# Patient Record
Sex: Female | Born: 1945 | Hispanic: Yes | Marital: Married | Smoking: Never smoker
Health system: Southern US, Community
[De-identification: ages and names within clinical notes are randomized; demographics above are authoritative.]

---

## 2005-07-18 ENCOUNTER — Ambulatory Visit: Payer: Self-pay

## 2006-08-28 ENCOUNTER — Ambulatory Visit: Payer: Self-pay

## 2007-11-11 ENCOUNTER — Ambulatory Visit: Payer: Self-pay

## 2009-01-27 ENCOUNTER — Ambulatory Visit: Payer: Self-pay | Admitting: Family Medicine

## 2009-02-01 ENCOUNTER — Ambulatory Visit: Payer: Self-pay

## 2009-11-29 IMAGING — CR DG CHEST 1V
1 series · 2 of 2 positions shown · non-contrast
Comparison: none

REASON FOR EXAM: +ppd
COMMENTS:

[Series 1: view not recorded · 0.17mm/px · 2 of 2 slices shown]
[im 1/2]
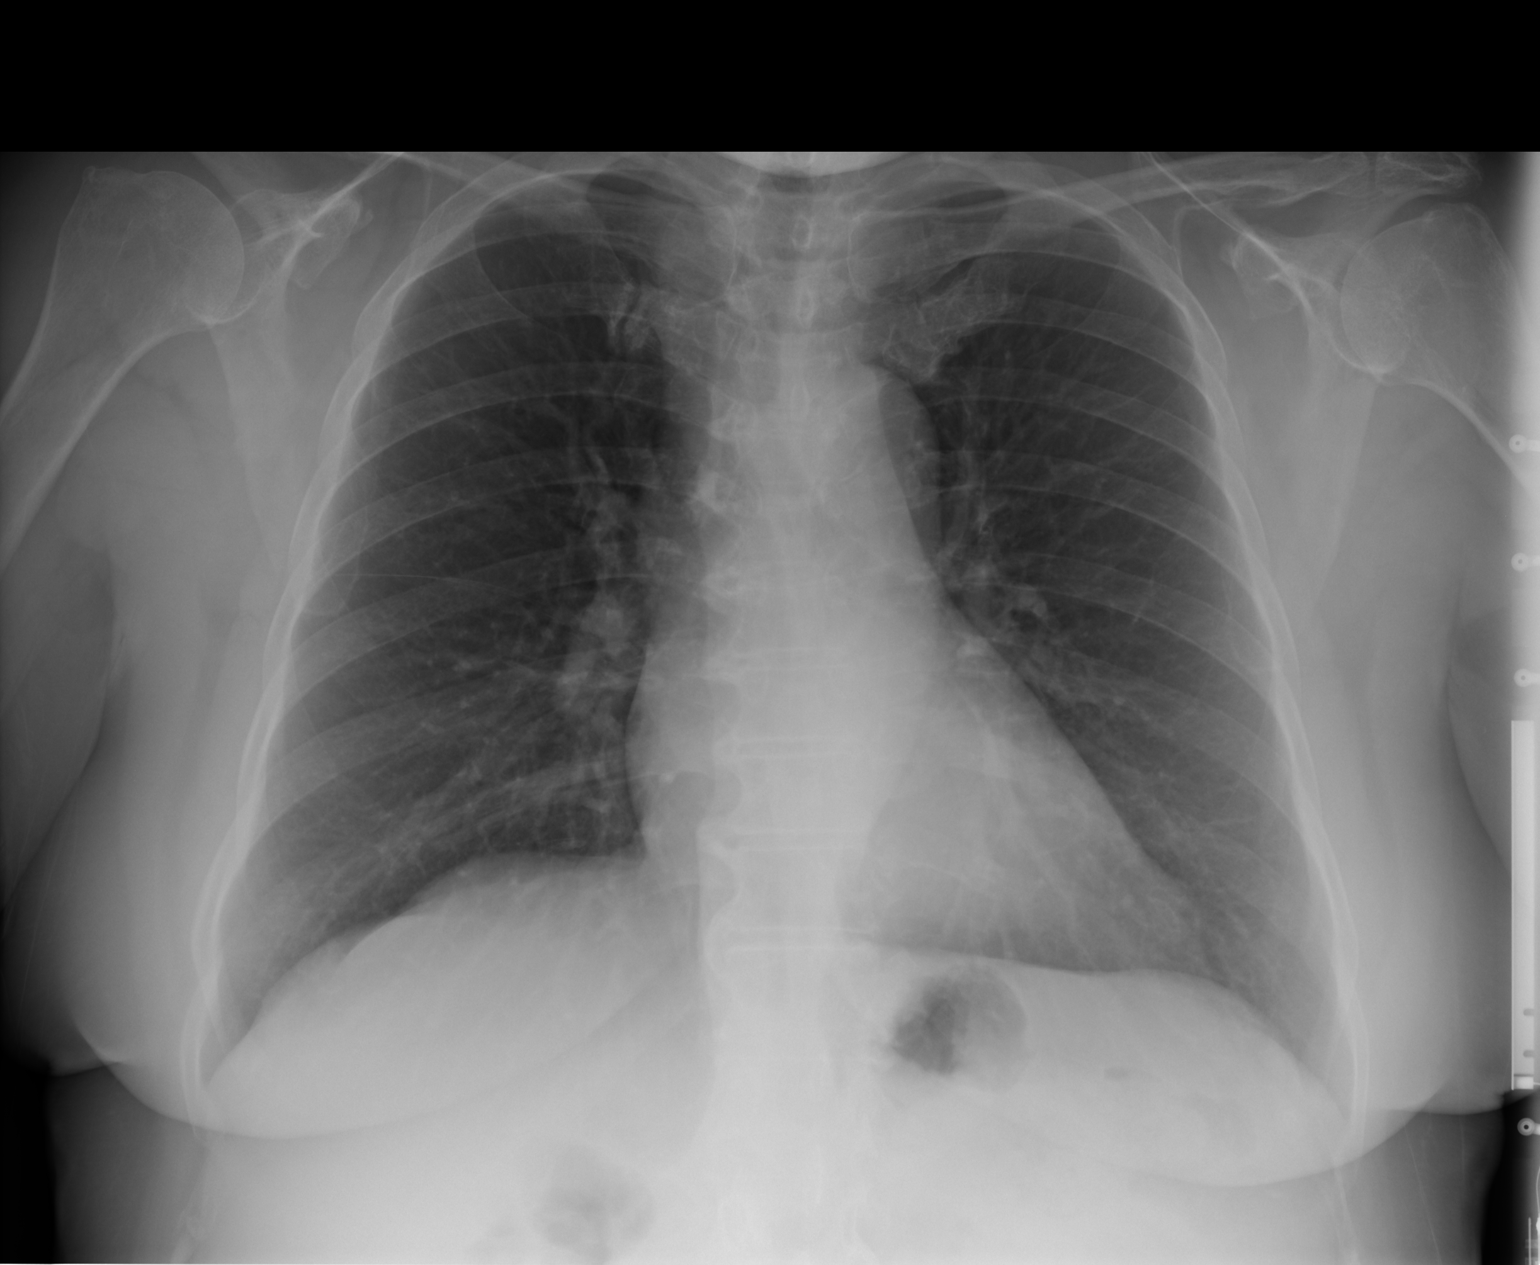
[im 2/2]
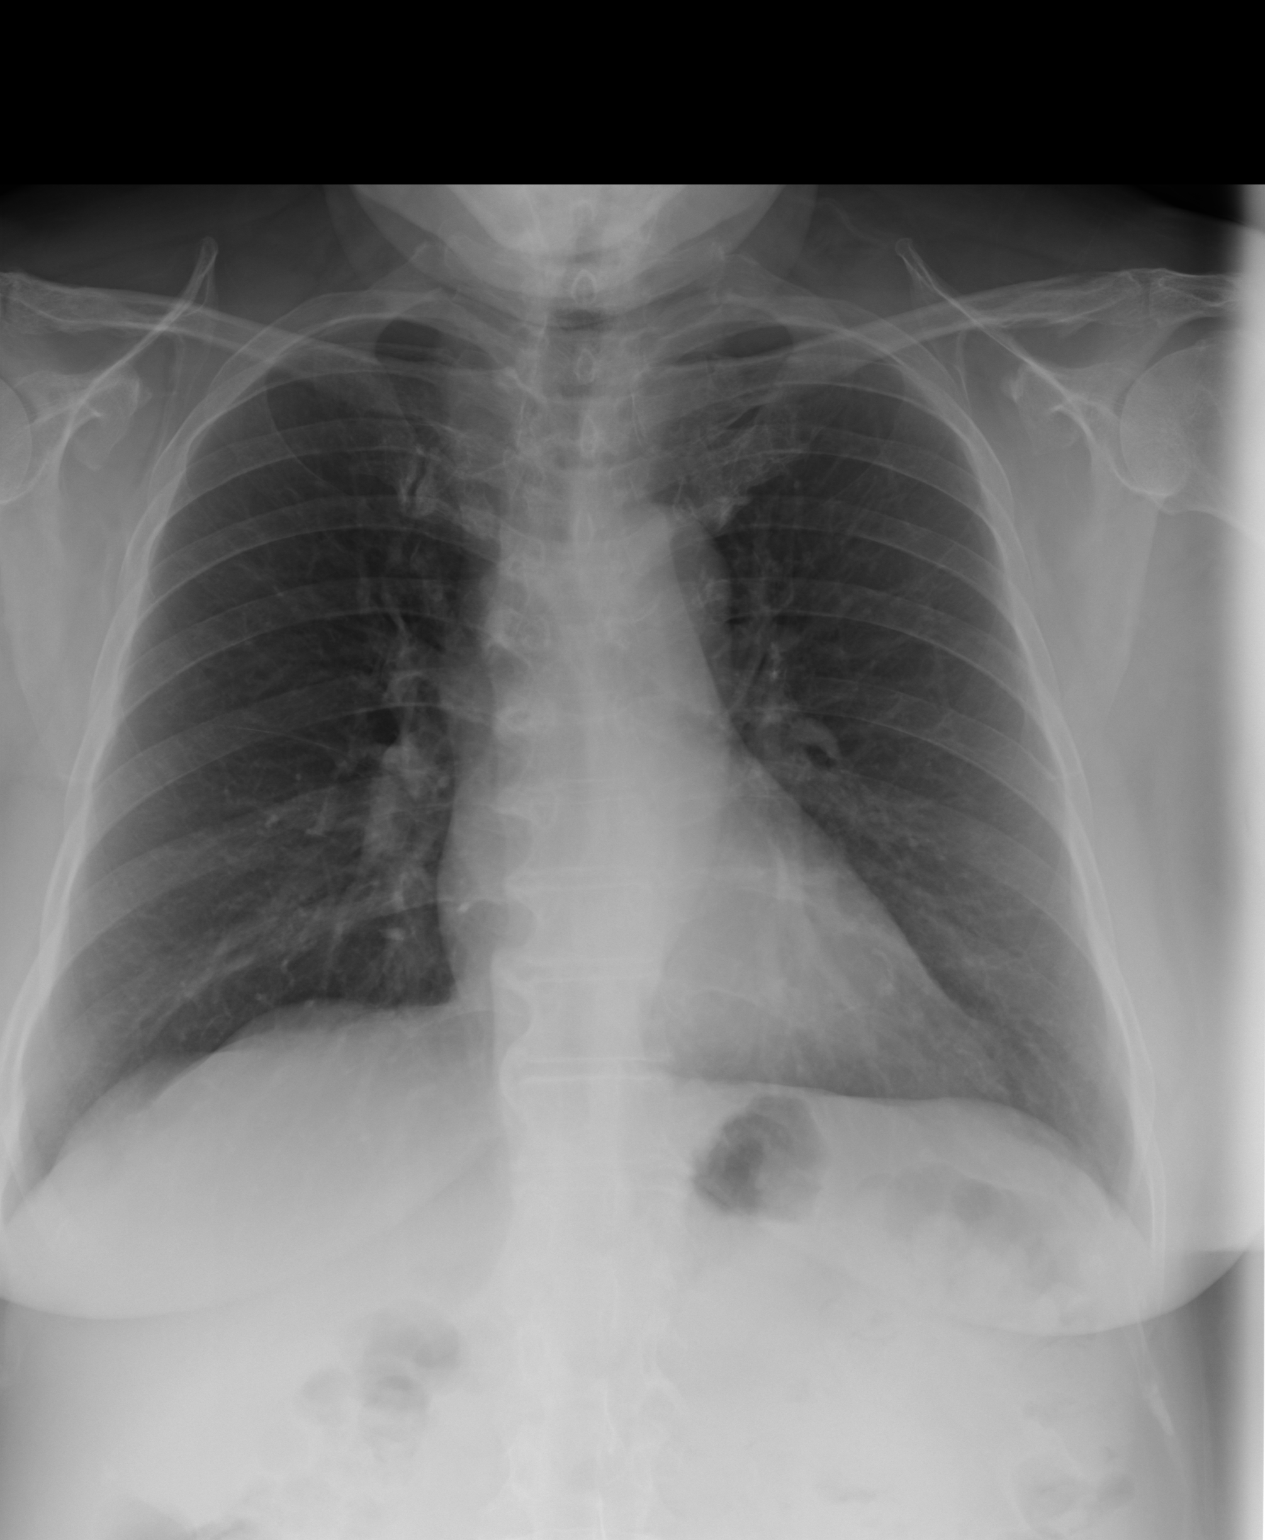

[2 of 2 positions shown; findings below may reference images not displayed]

PROCEDURE:     DXR - DXR CHEST 1 VIEWAP OR PA  - January 27, 2009  [DATE]

RESULT:     The lung fields are clear. No pneumonia, pneumothorax or pleural
effusion is seen. Attention to the lung apices shows no infiltrate or cystic
change suspicious for tuberculosis. Heart size is normal. Hypertrophic
spurring is present at multiple levels of the thoracic spine.
IMPRESSION: 1.     No acute changes are identified.

## 2010-05-10 ENCOUNTER — Ambulatory Visit: Payer: Self-pay | Admitting: Family Medicine

## 2012-03-17 ENCOUNTER — Ambulatory Visit: Payer: Self-pay | Admitting: Family Medicine

## 2012-05-13 ENCOUNTER — Ambulatory Visit: Payer: Self-pay | Admitting: Primary Care

## 2012-05-15 ENCOUNTER — Ambulatory Visit: Payer: Self-pay | Admitting: Primary Care

## 2013-01-17 IMAGING — US US THYROID
1 series · 17 of 25 positions shown · non-contrast
Comparison: none

REASON FOR EXAM: thyroid mass
COMMENTS:

[Series 1: us thyroid · 123 acquisitions, 17 frames shown]
[im 1/123]
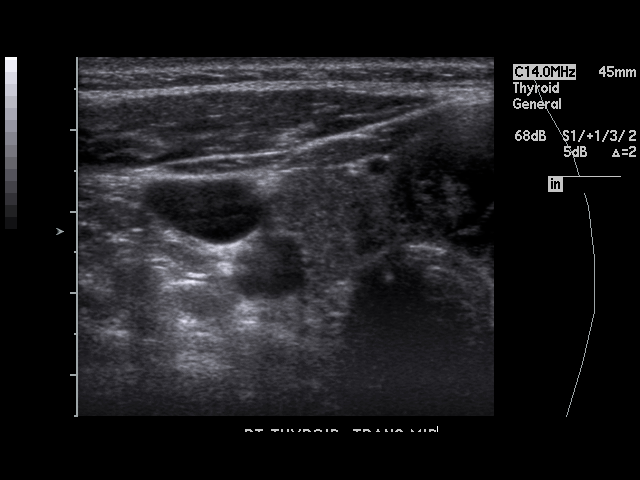
[im 11/123]
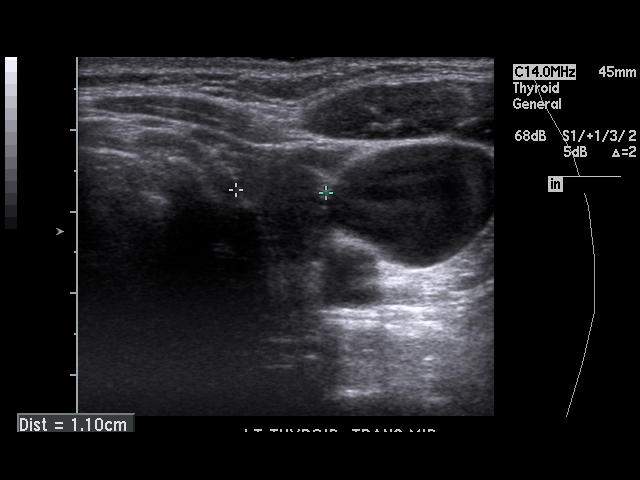
[im 16/123]
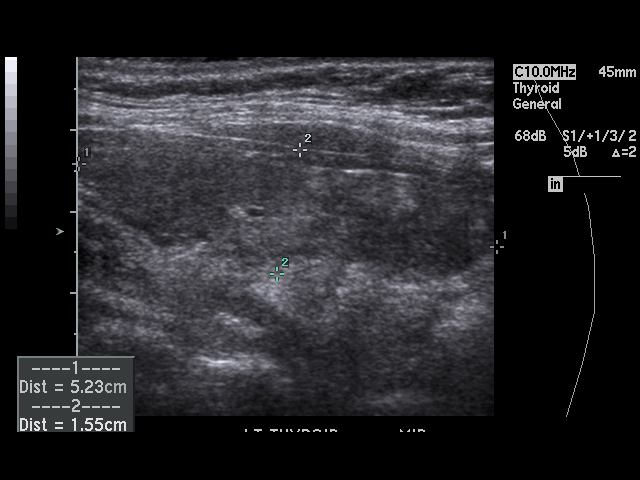
[im 26/123]
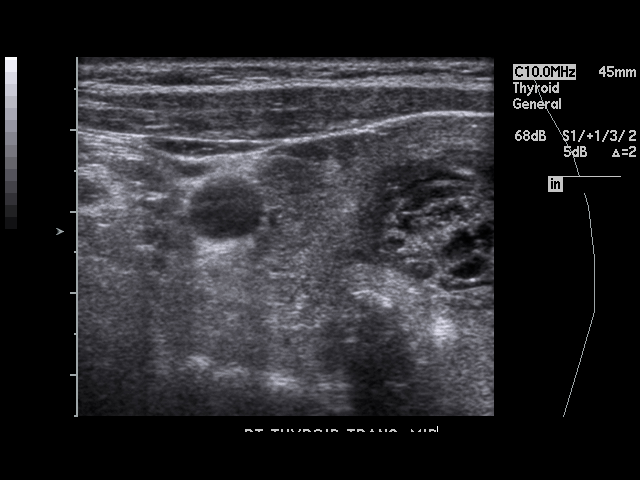
[im 31/123]
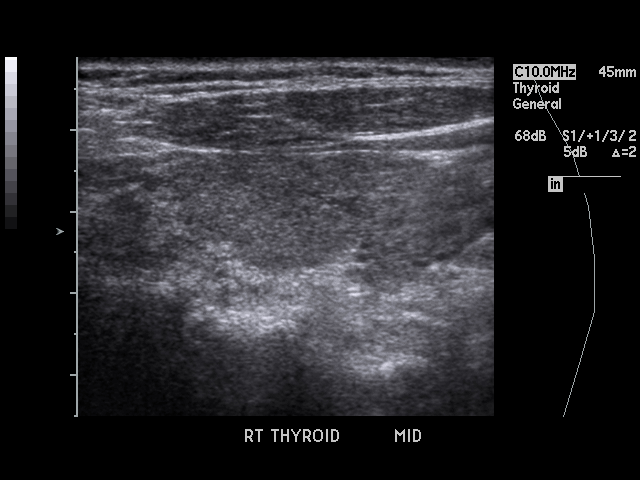
[im 41/123]
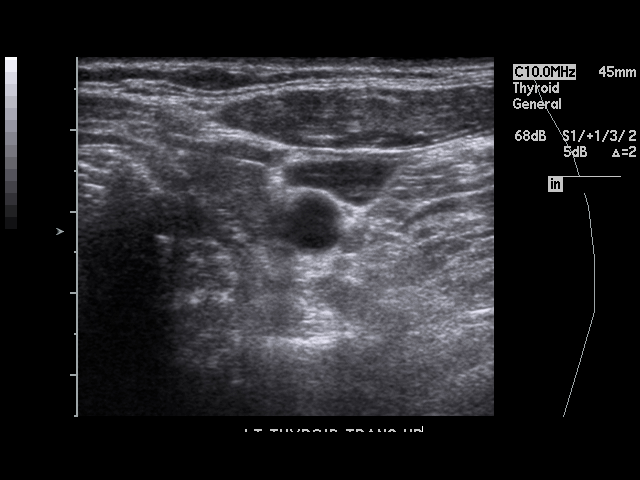
[im 46/123]
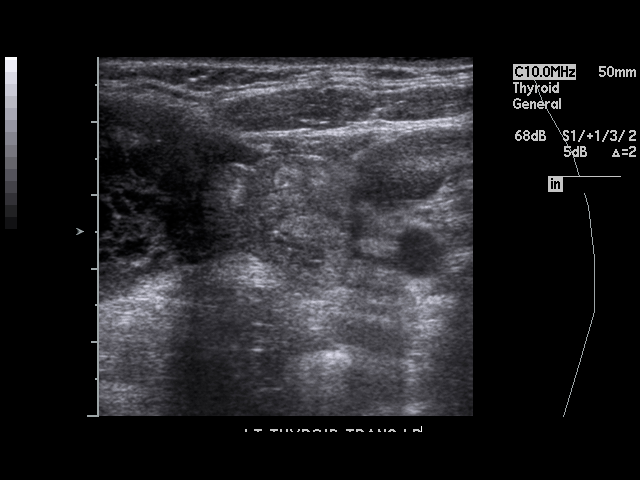
[im 56/123]
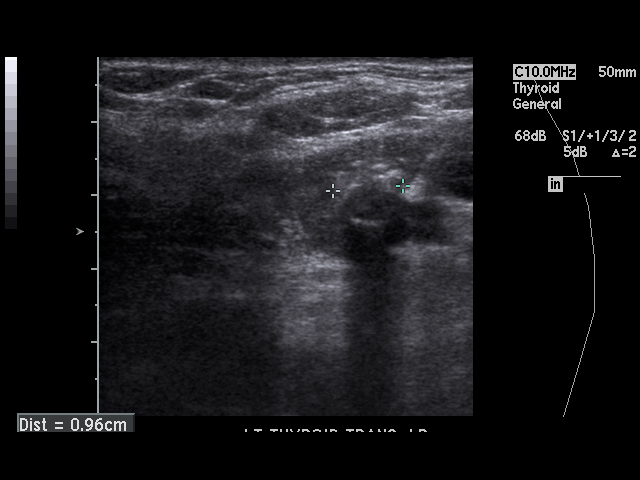
[im 62/123]
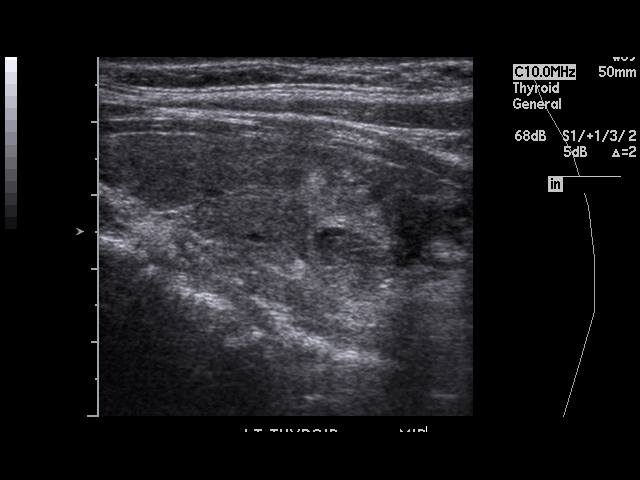
[im 67/123]
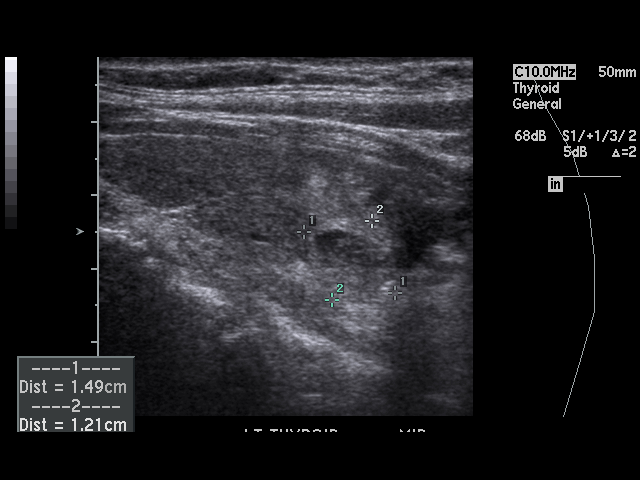
[im 77/123]
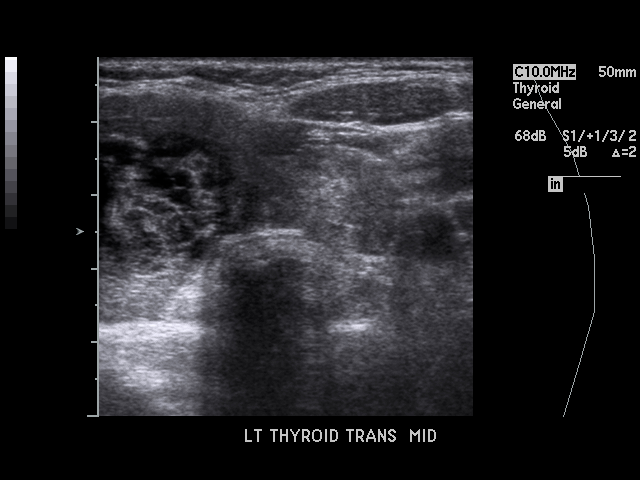
[im 82/123]
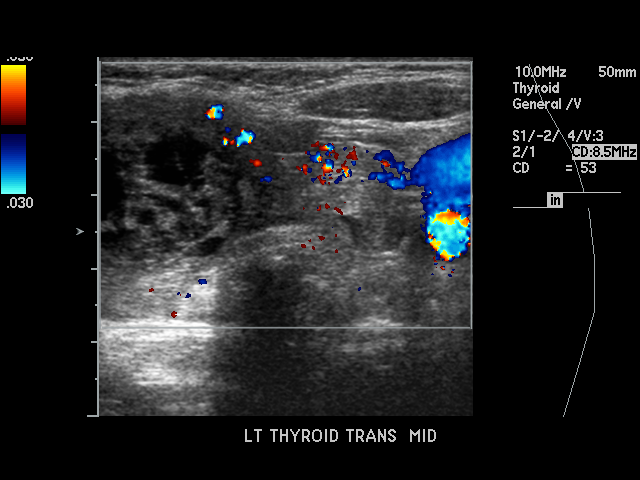
[im 92/123]
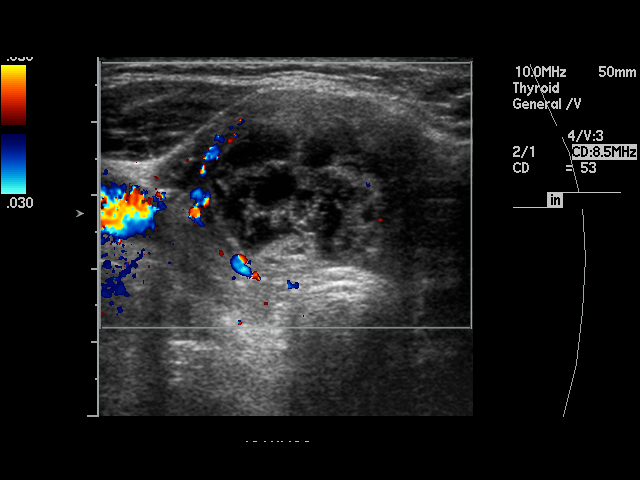
[im 97/123]
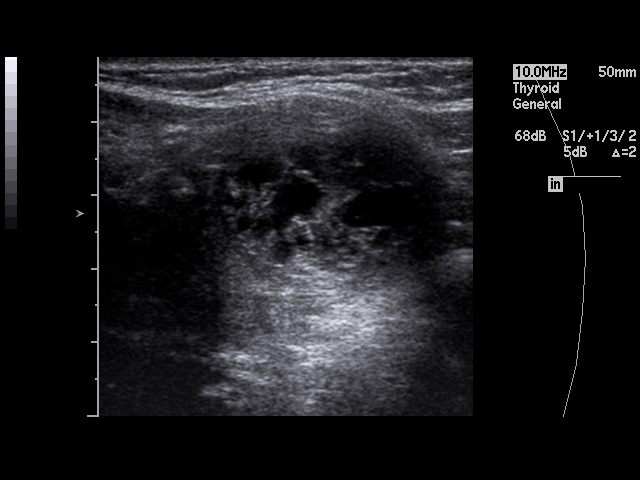
[im 107/123]
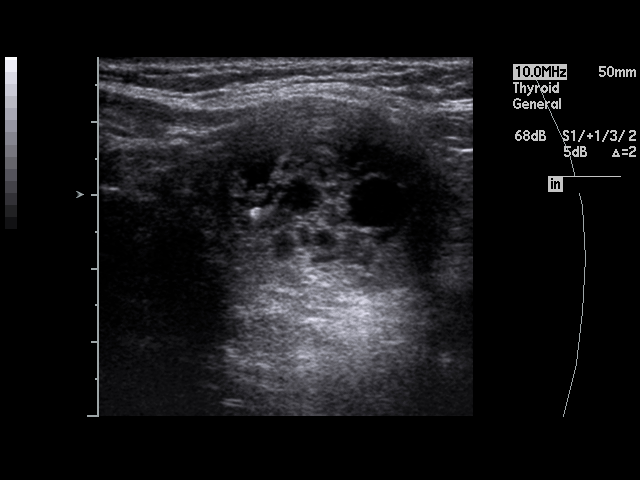
[im 112/123]
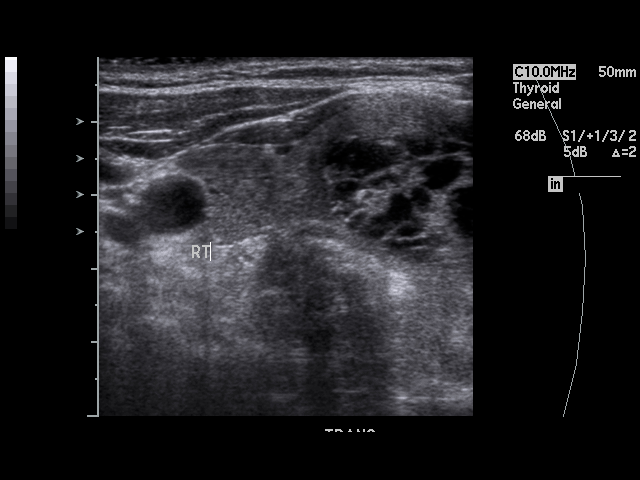
[im 123/123]
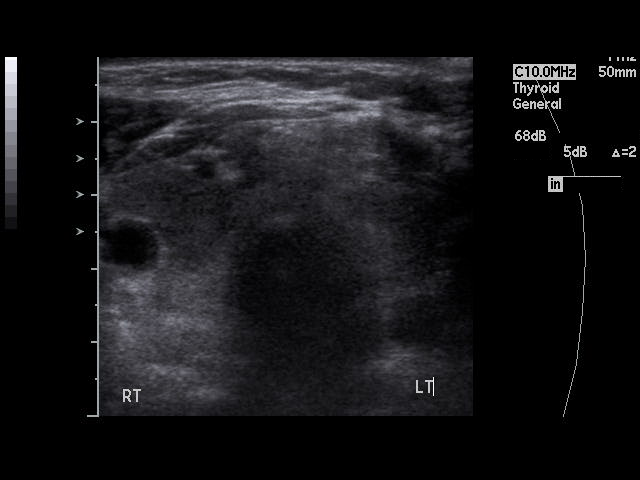

[17 of 25 positions shown; findings below may reference images not displayed]

PROCEDURE:     US  - US THYROID  - March 17, 2012  [DATE]

RESULT:     The right lobe of the thyroid measures 3.2 cm x 1.4 cm x 1.6 cm
and the left lobe measures 5.2 cm x 1.6 cm x 1.1 cm. In the thyroid isthmus,
there is a complex mass containing cystic elements and measuring 3.19 cm x
2.8 cm x 3.19 cm. In the right lobe of the thyroid, there is a slightly
hyperechoic nodule in the midpole region. This nodule measures 6.4 mm x
mm x 5.5 mm. In the left lobe, there are observed three nodules. The most
prominent is at the lower pole where there is a hypoechoic nodule measuring
1.27 cm in diameter. This nodule contains coarse calcifications. There is
also a 1.49 cm hypoechoic nodule at the lower pole and in the mid pole
region on the left there is a 1.03 cm, slightly hyperechoic solid nodule. No
associated calcifications are seen.
IMPRESSION: 1.  The left lobe is larger than the right.
2.  There is a complex, 3.19 cm mass of the thyroid isthmus.
3.  On the right, there is a hyperechoic 6.4 mm nodule.
4.  On the left, there are three nodules with the most prominent being at
the lower pole. This is a complex nodule containing coarse calcification.

## 2013-01-21 ENCOUNTER — Ambulatory Visit: Payer: Self-pay | Admitting: Primary Care

## 2013-07-28 ENCOUNTER — Encounter: Payer: Self-pay | Admitting: Urology

## 2013-08-24 ENCOUNTER — Encounter: Payer: Self-pay | Admitting: Urology

## 2013-09-23 ENCOUNTER — Encounter: Payer: Self-pay | Admitting: Urology

## 2013-10-24 ENCOUNTER — Encounter: Payer: Self-pay | Admitting: Urology

## 2013-11-13 ENCOUNTER — Ambulatory Visit (INDEPENDENT_AMBULATORY_CARE_PROVIDER_SITE_OTHER): Payer: Medicare Other | Admitting: Allergy

## 2014-07-01 ENCOUNTER — Ambulatory Visit: Payer: Self-pay | Admitting: Primary Care

## 2014-07-08 ENCOUNTER — Ambulatory Visit: Payer: Self-pay | Admitting: Primary Care

## 2016-06-28 ENCOUNTER — Other Ambulatory Visit: Payer: Self-pay | Admitting: Primary Care

## 2016-06-28 DIAGNOSIS — Z1231 Encounter for screening mammogram for malignant neoplasm of breast: Secondary | ICD-10-CM

## 2016-07-20 ENCOUNTER — Ambulatory Visit: Payer: Self-pay

## 2016-07-30 ENCOUNTER — Encounter: Payer: Self-pay | Admitting: Radiology

## 2016-07-30 ENCOUNTER — Ambulatory Visit
Admission: RE | Admit: 2016-07-30 | Discharge: 2016-07-30 | Disposition: A | Discharge status: Home / Self Care | Payer: Self-pay | Source: Ambulatory Visit | Attending: Primary Care | Admitting: Primary Care

## 2016-07-30 DIAGNOSIS — Z1231 Encounter for screening mammogram for malignant neoplasm of breast: Secondary | ICD-10-CM

## 2017-08-09 ENCOUNTER — Other Ambulatory Visit: Payer: Self-pay | Admitting: Primary Care

## 2017-08-09 DIAGNOSIS — Z1231 Encounter for screening mammogram for malignant neoplasm of breast: Secondary | ICD-10-CM

## 2017-08-09 DIAGNOSIS — Z1239 Encounter for other screening for malignant neoplasm of breast: Secondary | ICD-10-CM

## 2017-08-23 ENCOUNTER — Ambulatory Visit
Admission: RE | Admit: 2017-08-23 | Discharge: 2017-08-23 | Disposition: A | Discharge status: Home / Self Care | Payer: Self-pay | Source: Ambulatory Visit | Attending: Primary Care | Admitting: Primary Care

## 2017-08-23 DIAGNOSIS — Z1239 Encounter for other screening for malignant neoplasm of breast: Secondary | ICD-10-CM

## 2018-05-01 ENCOUNTER — Other Ambulatory Visit: Payer: Self-pay | Admitting: Primary Care

## 2018-05-01 DIAGNOSIS — Z1231 Encounter for screening mammogram for malignant neoplasm of breast: Secondary | ICD-10-CM

## 2018-05-21 ENCOUNTER — Telehealth (HOSPITAL_BASED_OUTPATIENT_CLINIC_OR_DEPARTMENT_OTHER): Payer: Self-pay | Admitting: Allergy

## 2018-05-21 NOTE — Telephone Encounter (Signed)
Pt have an appt schedule for 08/29/2018. Pt stated that she is unable to wait until September because she was at the ED due to allergies. Pt requesting sooner appt. Please review and advise.    Thank you

## 2018-05-22 ENCOUNTER — Telehealth (HOSPITAL_BASED_OUTPATIENT_CLINIC_OR_DEPARTMENT_OTHER): Payer: Self-pay | Admitting: Allergy

## 2018-05-22 NOTE — Telephone Encounter (Signed)
Spoke with pt and explained that additional records are needed to be properly treated by allergy MD.    See message below  Message forwarded to C. Avina for additional outside notes

## 2018-05-22 NOTE — Telephone Encounter (Signed)
Please obtain the medical records from the ED visit. Also, referral states multiple allergies - unclear what the allergy concerns are - ED visit appears to have been for hyponatremia, possibly from thiazide diuretic and sertraline, which is a known severe complication from dual usage together. This would help determine how we can help, and if sooner.

## 2018-05-22 NOTE — Telephone Encounter (Signed)
Records are incomplete for allergy consult  See enc 05/22/18  Routing back to C. Avina for additional notes

## 2018-05-22 NOTE — Telephone Encounter (Signed)
Please review notes in media and let me know if you want additional medical records in order for me to schedule pt earlier.  Routing to Dr. Selena Batten

## 2018-05-26 NOTE — Telephone Encounter (Signed)
Please follow up on medical records.  Thank you    Forwarded to A. [place]

## 2018-05-26 NOTE — Telephone Encounter (Signed)
Please f/u on medical records  Forwarded to C. Avina

## 2018-08-26 ENCOUNTER — Other Ambulatory Visit: Payer: Self-pay | Admitting: Primary Care

## 2018-08-26 ENCOUNTER — Ambulatory Visit
Admission: RE | Admit: 2018-08-26 | Discharge: 2018-08-26 | Disposition: A | Discharge status: Home / Self Care | Payer: Self-pay | Source: Ambulatory Visit | Attending: Primary Care | Admitting: Primary Care

## 2018-08-26 DIAGNOSIS — Z1231 Encounter for screening mammogram for malignant neoplasm of breast: Secondary | ICD-10-CM

## 2018-08-28 NOTE — Progress Notes (Signed)
Allergy/Immunology New Consultation    Date: 08/29/2018  Referring MD: Dominica Severin  Reason for consultation: Evaluate for allergies  ______________________________________________________________________    HPI: Nichole Hill is a 72 year old female here for new consultation for allergy contributions to dizziness. They have been referred by Dominica Severin and is a new patient to me. Prior medical records have been reviewed carefully prior to this consultation and salient details have been summarized below.     Patient states her primary concerns today are allergic contributions to dizziness. Wakes up dizzy with imbalance issue and underwent prior work-up for this which she reports is negative. Sneezing, rhinorrhea worse in morning associated with epiphora. Dog at home, no symptoms around dog. She has intermittent vision loss and states dizziness has been severe at times. Symptoms generally improve as day progresses. Does have notably chart history of hospitalization in February 2019 for hyponatremia thought to be due to medications.     8 months ago, developed pruritus and diffuse rashes on back, arms, feet that were migratory and saw Allergist who was concerned of eczema vs urticaria. Food testing positive to corn, wheat, almonds. Did start avoiding these foods and did commence with Claritin. No continued pruritus or rash.     Labwork revealed minimal absolute eosinophilia at 10-280.     CT head 05/18/2018:  FINDINGS:  CT HEAD:  Intracranial:  No acute findings. No intracranial hemorrhage, mass effect, or midline shift. No noncontrast CT evidence for acute infarct.  Brain volume within normal limits for age.    Extracranial:  SOFT TISSUES: Normal. Extracranial soft tissues unremarkable.  BONES: Normal. Calvarium intact.  SINUSES: Normal. Visualized paranasal sinuses clear.  MASTOIDS: Normal. Mastoid air cells clear.  OTHER: None.    Past Medical and Surgical History:  No past medical history on file.  No past  surgical history on file.    Allergies:  Allergies   Allergen Reactions   . Almond Meal Rash   . Captopril Rash   . Chlorthalidone Rash   . Corn  & Callus Remover [Salicylic Acid] Rash   . Wheat Bran Rash   . Zinc Acetate Rash   . Sertraline Unspecified       Medications:    Current Outpatient Medications:   .  amLODIPINE-benazepril (LOTREL) 2.5-10 MG capsule, Take 1 capsule by mouth daily., Disp: , Rfl:   .  Levothyroxine Sodium 50 MCG/ML SOLN, , Disp: , Rfl:   .  loratadine (CLARITIN) 10 MG tablet, Take 10 mg by mouth daily., Disp: , Rfl:   .  propranolol (INDERAL) 10 MG tablet, Take 10 mg by mouth 2 times daily., Disp: , Rfl:       FH: No FH of atopic disease: asthma, allergic rhinitis, eczema, food allergies    Review of Systems:  General: No fevers, chills, weight loss, night sweats.  Neuro: +dizziness, imbalance  HEENT: See HPI  Resp: No cough, SOB, wheezing, chest tightness  CV: No murmur, palpitations, chest pain  GI: No vomiting, diarrhea, abdominal pain  Musc: No arthralgias, no joint swelling  Skin: No skin lesions or urticaria currently; prior pruritus/rash  Complete 14 point ROS negative except as outlined above.    Physical Exam:   08/29/18  0914   BP: 146/79   Pulse: 68   Temp: 98.3 F (36.8 C)     Constitutional: WNWD, NAD, well-groomed  HEENT: NCAT, no conjunctivitis, no allergic shiners  Neck: normal thyroid, trachea midline  Lymph: no cervical or axillary lymphadenopathy  Cardiovascular: RRR no murmur, no edema  Respiratory: no respiratory distress, no accessory muscle usage  Skin: no rashes or lesions  Ext: no clubbing, petechiae, cyanosis  Neuro/psych: alert, awake, oriented to time, place, person; normal affect and insight    Medical Decision Making:  Previous medical records were reviewed in detail prior to this visit    Consultation Parameters:  1. Referring physician requesting consultation: Dominica Severin  2. Reason for consultation: rash and other nonspecific skin eruption  3. Services  rendered during consultation: dermatitis, dizziness, adverse food reaction, chronic rhinitis   4. Dictation to referring provider regarding consultation: electronic dictation sent to Dominica Severin    Impression:  72 year old here for evaluation of:  1. Dizziness  2. Chronic rhinitis  3. Adverse food reaction  4. Dermatitis     Recommendations:  1. Dizziness  Concerns that the ongoing dizziness, imbalance and transient vision loss is due to non-allergic etiology but explained to patient that this was out of the scope of my practice and that continued evaluation by PCP, Cardiology, neurology would be needed. Explained to patient that allergic contributions to dizziness would be atypical, but that we could optimize sinus treatment with azelastine to determine if there was any amelioration of symptoms. Clearly informed patient that we were not experts in dizziness and that the medication was being provided for allergic rhinitis not for dizziness.   -Aero-allergen in-vitro testing   -Start azelastine 2 sprays each nostril twice a day.   -Continue work-up with PCP, cardiology, neurology, and possibly ophthalmology given vision concerns - defer to PCP    2. Chronic rhinitis  Clinical history is suggestive of possible allergic rhinitis, and she will undergo aero-allergen in-vitro testing to identify current and clinically relevant sensitizations. Given ongoing symptoms, strongly recommend azelastine trial to control rhinorrhea and sneezing.   -Start azelastine 2 sprays each nostril twice a day.   -Proper inhaler technique demonstrated and reviewed.    3. Adverse food reaction  Will resend IgE to corn, wheat, almond, and she will continue to avoid these foods.    4. Dermatitis  Resolved at the moment, with possible food triggers. Will resend IgE to corn, wheat, almond, and she will continue to avoid these foods. Vanicream recommended as daily moisturization.     RTC in 2 months     The plan of care was discussed with the  patient who expressed agreement and understanding. Questions were answered prior to conclusion of the visit. Patient seen and examined with Dr Berneice Heinrich Patel--Concur with dx and management as outlined in the consultative report. I have seen and examined the patient with Dr. Burke Keels and agree with the history and physical exam.         Rockie Neighbours, MD  Clinical Assistant Professor, [facility]

## 2018-08-29 ENCOUNTER — Encounter

## 2018-08-29 ENCOUNTER — Other Ambulatory Visit (HOSPITAL_BASED_OUTPATIENT_CLINIC_OR_DEPARTMENT_OTHER): Payer: 59

## 2018-08-29 ENCOUNTER — Encounter (HOSPITAL_BASED_OUTPATIENT_CLINIC_OR_DEPARTMENT_OTHER): Payer: Self-pay | Admitting: Allergy

## 2018-08-29 ENCOUNTER — Ambulatory Visit: Payer: 59 | Attending: Allergy | Admitting: Allergy

## 2018-08-29 VITALS — BP 146/79 | HR 68 | Temp 98.3°F | Ht 60.0 in | Wt 118.0 lb

## 2018-08-29 DIAGNOSIS — J310 Chronic rhinitis: Secondary | ICD-10-CM

## 2018-08-29 DIAGNOSIS — T781XXA Other adverse food reactions, not elsewhere classified, initial encounter: Principal | ICD-10-CM | POA: Insufficient documentation

## 2018-08-29 DIAGNOSIS — R42 Dizziness and giddiness: Secondary | ICD-10-CM | POA: Insufficient documentation

## 2018-08-29 MED ORDER — LEVOTHYROXINE SODIUM 50 MCG/ML PO SOLN: ORAL | Status: AC

## 2018-08-29 MED ORDER — PROPRANOLOL HCL 10 MG OR TABS: 10.00 mg | ORAL_TABLET | Freq: Two times a day (BID) | ORAL | Status: AC

## 2018-08-29 MED ORDER — LORATADINE 10 MG OR TABS: 10.00 mg | ORAL_TABLET | Freq: Every day | ORAL | Status: AC

## 2018-08-29 MED ORDER — AMLODIPINE-BENAZEPRIL 2.5-10 MG OR CAPS: 1.00 | ORAL_CAPSULE | Freq: Every day | ORAL | Status: AC

## 2018-08-29 MED ORDER — AZELASTINE HCL 137 MCG/SPRAY NA SOLN
1.00 | Freq: Two times a day (BID) | NASAL | 2 refills | Status: AC
Start: 2018-08-29 — End: ?

## 2018-08-29 NOTE — Progress Notes (Signed)
Allergy & Immunology Clinic Initial Visit Note    Reason for Consultation: Allergies    HPI: Nichole Hill is a 72 year old female, who presents to discuss her allergies.     She reports that she wakes up every morning feeling dizzy and feeling like falling to one side. She usually feels better later in the day. She also states that she occasionally cannot see. She reports having had multiple tests done that have not shown a cause, so she believes her dizziness is due to allergies.      She states that many years ago she started having a lot of itching with red bumps with pain/burning on her back, arms, and feet. She thinks these bumps would come and go, but were there daily. She does not have any pictures with her.      She feels this got worse 8 months ago while she was visiting her daughter in [place]. She saw an allergy specialist there, who never gave her a diagnosis, but mentioned it could be eczema or urticaria. She had skin testing done, which was positive to almonds, aerosol?, wheat, and corn. She states the testing was only done for food allergens. Of those foods she was only really eating corn. She started taking loratadine daily and avoiding corn products and started feeling better. She states the itching is gone and she only occasionally gets a few small bumps, but thinks her symptoms may have now internalized? She still has a burning sensation in her legs. She only uses the loratadine occasionally for dizziness now.      She endorses watery eyes, and increased sneezing and runny nose in the mornings. She believes this is improved after stopping stopping corn products. She has a dog at home and has not noticed worse symptoms around it. Her mattress is 71 years old. She does not have dust mite covers. She sometimes feels like small bugs are crawling on her at night. She does not have carpet at home.     She denies a history or symptoms of asthma and angioedema.     Outside records in media reviewed.  Patient was hospitalized earlier this year (02/14/18 - 02/17/18) for 3 days of nausea, vomiting, and abdominal pain, and found to have hypo-osmolar hyponatremia likely secondary to addition of SSRI and chlorthalidone. Sodium improved with holding these medications.     Review of Systems:  12 point ROS completed and negative unless otherwise noted in HPI.    PMH:  Hypothyroidism  Anxiety  HTN  Nephrolithiasis  Osteoporosis  CKD    MEDS:  Current Outpatient Medications on File Prior to Visit   Medication Sig Dispense Refill   . amLODIPINE-benazepril (LOTREL) 2.5-10 MG capsule Take 1 capsule by mouth daily.     . Levothyroxine Sodium 50 MCG/ML SOLN      . loratadine (CLARITIN) 10 MG tablet Take 10 mg by mouth daily.     . propranolol (INDERAL) 10 MG tablet Take 10 mg by mouth 2 times daily.       No current facility-administered medications on file prior to visit.      ALLERGIES:  Allergies   Allergen Reactions   . Almond Meal Rash   . Captopril Rash   . Chlorthalidone Rash   . Corn  & Callus Remover [Salicylic Acid] Rash   . Wheat Bran Rash   . Zinc Acetate Rash   . Sertraline Unspecified     SH:  Living Situation: Lives in [place]  with son and daughter-in-law and 3 grandchildren  Occupation: Advertising copywriter, caregiver  Tobacco: Never smoker, exposed to husband smoking  Alcohol: Denies   Illicit Drugs: Denies    FH:  Patient denies any family history of allergies or asthma.    ----------------------------------------------------------------------------------------------  OBJECTIVE:  Vitals:  BP 146/79 (BP Location: Left arm, BP Patient Position: Sitting, BP cuff size: Regular)   Pulse 68   Temp 98.3 F (36.8 C) (Oral)   Ht 5' (1.524 m)   Wt 53.5 kg (118 lb)   BMI 23.05 kg/m     Wt Readings from Last 2 Encounters:   08/29/18 53.5 kg (118 lb)     Physical Exam:  General: pleasant, well-appearing, in NAD  HEENT: NC/AT, MMM, nares appear normal, oropharynx without lesions or exudates, no facial TTP  CV: RRR, no  murmurs appreciated  Pulm: unlabored breathing on room air, CTAB  Abd: soft, nondistended, nontender, bowel sounds normoactive  Ext: WWP, no edema  Skin: no rashes or lesions appreciated  Neuro: alert and oriented, PERRL, EOMI, normal gait    LABS:  Reviewed Care Everywhere  Chem (05/30/18) notable for Cr 1.31  CBC (05/18/18) wnl, abs eos 60    IMAGING:  DATE/TIME:  05/18/2018 5:05 PM  HISTORY:  Head trauma, headache  COMPARISON:  None.  TECHNIQUE:  CT examination was performed without intravenous contrast.  FINDINGS:  CT HEAD:  Intracranial:  No acute findings. No intracranial hemorrhage, mass effect, or midline shift. No noncontrast CT evidence for acute infarct.  Brain volume within normal limits for age.  Extracranial:  SOFT TISSUES: Normal. Extracranial soft tissues unremarkable.  BONES: Normal. Calvarium intact.  SINUSES: Normal. Visualized paranasal sinuses clear.  MASTOIDS: Normal. Mastoid air cells clear.  OTHER: None.  IMPRESSION: Unremarkable head CT.  No significant intracranial abnormality.    ----------------------------------------------------------------------------------------------  ASSESSMENT & PLAN:   Nichole Hill is a 72 year old female, who presents to discuss her allergies. She reports having had food allergen skin testing that was positive for almonds, wheat, and corn. Her history of cutaneous pruritis and rash is unclear (especially as eczema and urticaria and not usually confused), but it seems to have resolved since avoiding corn products and using loratadine. She does endorse symptoms of rhinoconjunctivitis, which could be related to allergies; however, her various other concerns, including dizziness, visual disturbances, and burning legs is unlikely to be related to allergies. We plan to do testing to assess for allergies and start treatment for her rhinoconjunctivitis to see if it helps with her main concern of dizziness, but we recommended she continue seeking work-up through her PCP.        - Obtain in-vitro inhalant allergen profile and IgE almonds, wheat, and corn  - Start azelastine nasal spray  - Okay to continue loratadine prn   - Patient instructed to follow-up with PCP for continued dizziness work-up    Patient seen and discussed with Dr. Selena Batten.     Burke Keels, MD  Internal Medicine PGY3

## 2018-08-29 NOTE — Patient Instructions (Addendum)
Resumen de la visita:    - No sabemos si su mareo se debe a alergias, pero podemos tratarlos para ver si ayuda con su mareo  - Recomendamos hacerse anlisis de sangre de alergia para los alimentos a los que fue positivo y a los alrgenos ambientales   - Tambin recomendamos un aerosol nasal llamado azelastina a la cadera con los sntomas nasales   - Tambin recomendamos usar un humectante llamado Vanicream para tu piel    After visit summary:    - We do not know if your dizziness is due to allergies, but we can try to treat them to see if it helps with your dizziness  - We recommend getting allergy blood testing for the foods you were positive to and to environmental allergens   - We also recommend a nasal spray called azelastine to hip with your nasal symptoms   - We also recommend using a moisturizer called Vanicream for your skin

## 2018-09-02 LAB — ALLERGENS, INHALANT PROFILE
Allergen, Bermuda Grass IgE: <0.1 kU/L (ref <0.10)
Allergen, Cat IgE: <0.1 kU/L (ref <0.10)
Allergen, Common /Short Weed IgE: <0.1 kU/L (ref <0.10)
Allergen, Cottonwood Tree IgE: <0.1 kU/L (ref <0.10)
Allergen, Dog IgE: <0.1 kU/L (ref <0.10)
Allergen, Elm Tree IgE: <0.1 kU/L (ref <0.10)
Allergen, English Plantain Weed IgE: <0.1 kU/L (ref <0.10)
Allergen, Fungi/Mold Alternaria alternata IgE: <0.1 kU/L (ref <0.10)
Allergen, Fungi/Mold Aspergillus fumigatus IgE: <0.1 kU/L (ref <0.10)
Allergen, Fungi/Mold Cladosporum herbarum IgE: <0.1 kU/L (ref <0.10)
Allergen, Fungi/Mold Penicillium chrysogenum IgE: <0.1 kU/L (ref <0.10)
Allergen, Grey Alder Tree IgE: <0.1 kU/L (ref <0.10)
Allergen, Insect Cockroach German IgE: <0.1 kU/L (ref <0.10)
Allergen, Johnson Grass IgE: <0.1 kU/L (ref <0.10)
Allergen, June/Kentucky Grass IgE: <0.1 kU/L (ref <0.10)
Allergen, Lamb Quarters Weed IgE: <0.1 kU/L (ref <0.10)
Allergen, Mesquite Tree IgE: <0.1 kU/L (ref <0.10)
Allergen, Mites, Dermatophagoides farinae IgE: <0.1 kU/L (ref <0.10)
Allergen, Mites, Dermatophagoides pteronyssinus IgE: <0.1 kU/L (ref <0.10)
Allergen, Mugwort Weed IgE: <0.1 kU/L (ref <0.10)
Allergen, Oak Tree IgE: <0.1 kU/L (ref <0.10)
Allergen, Olive Tree IgE: <0.1 kU/L (ref <0.10)
Allergen, Perennial Rye Grass IgE: <0.1 kU/L (ref <0.10)
Allergen, Russian Thistle Weed IgE: <0.1 kU/L (ref <0.10)
Allergen, Timothy Grass IgE: <0.1 kU/L (ref <0.10)
Allergen, Walnut Tree IgE: <0.1 kU/L (ref <0.10)
IGE: 33 kU/L (ref <=214)

## 2018-09-02 LAB — ALLERGEN, CORN IGE: Allergen, Corn IgE: <0.1 kU/L (ref <0.10)

## 2018-09-02 LAB — ALLERGEN, ALMOND IGE: Allergen, Almond IgE: <0.1 kU/L (ref <0.10)

## 2018-09-02 LAB — ALLERGEN, WHEAT IGE: Allergen, Wheat IgE: <0.1 kU/L (ref <0.10)

## 2018-09-18 ENCOUNTER — Telehealth (HOSPITAL_BASED_OUTPATIENT_CLINIC_OR_DEPARTMENT_OTHER): Payer: Self-pay | Admitting: Allergy

## 2018-09-18 NOTE — Telephone Encounter (Signed)
Caller: Patient    Relationship: Self    Reason for call: Pt is calling to ask how her latest labs she had done came out, pt would like a call with results.    Best call #: [phone]    Please assist, Thank you.

## 2018-09-19 NOTE — Telephone Encounter (Signed)
Allergy Staff call:    Called using [employer] interpreter (Spanish) and spoke with patient. She has improved significantly with azelastine and tolerating well. She also did receive evaluation from cardiology and that was normal, and will be seeing an ENT physician to check for any ear issues contributing to dizziness. The allergy testing was entirely negative. Patient confirms with interpreter twice, that before the other physician performed food testing to wheat, almond and corn, she was eating without any issues. She in fact tolerated pizza last week without any issues. Informed that given that history and our negative IgE testing, that she could reintroduce those foods. She will return in November for follow-up.

## 2018-09-19 NOTE — Telephone Encounter (Signed)
Seen by Allergist

## 2018-09-19 NOTE — Telephone Encounter (Signed)
Message forwarded to Dr. Kim for review

## 2018-10-29 NOTE — Progress Notes (Signed)
Allergy/Immunology Follow-up Visit:    Date: 10/31/2018  ______________________________________________________________________    HPI: Nichole Hill is a 72 year old female here for follow-up visit for allergy contributions to dizziness. She was seen for initial consultation on 08/29/2018 at which time recommendations were for aero-allergen in-vitro testing, start azelastine and continue work-up with PCP, cardiology, neurology, and possibly ophthalmology given vision concerns. IgE to corn, wheat almond requested.     She has improved significantly with azelastine and tolerating well. She also did receive evaluation from cardiology and that was normal, and will be seeing an ENT physician to check for any ear issues contributing to dizziness. The allergy testing was entirely negative. Patient confirms with interpreter twice, that before the other physician performed food testing to wheat, almond and corn, she was eating without any issues. She in fact tolerated pizza last week without any issues. Informed that given that history and our negative IgE testing, that she could reintroduce those foods.     Does have pruritus when she eats foods containing corn oil but does eat daily corn tortillas. She states that neurologic, ENT work-up for dizziness negative.      Initial consultation:  Patient states her primary concerns today are allergic contributions to dizziness. Wakes up dizzy with imbalance issue and underwent prior work-up for this which she reports is negative. Sneezing, rhinorrhea worse in morning associated with epiphora. Dog at home, no symptoms around dog. She has intermittent vision loss and states dizziness has been severe at times. Symptoms generally improve as day progresses. Does have notably chart history of hospitalization in February 2019 for hyponatremia thought to be due to medications.     8 months ago, developed pruritus and diffuse rashes on back, arms, feet that were migratory and saw Allergist  who was concerned of eczema vs urticaria. Food testing positive to corn, wheat, almonds. Did start avoiding these foods and did commence with Claritin. No continued pruritus or rash.     Labwork revealed minimal absolute eosinophilia at 10-280.     CT head 05/18/2018:  FINDINGS:  CT HEAD:  Intracranial:  No acute findings. No intracranial hemorrhage, mass effect, or midline shift. No noncontrast CT evidence for acute infarct.  Brain volume within normal limits for age.    Extracranial:  SOFT TISSUES: Normal. Extracranial soft tissues unremarkable.  BONES: Normal. Calvarium intact.  SINUSES: Normal. Visualized paranasal sinuses clear.  MASTOIDS: Normal. Mastoid air cells clear.  OTHER: None.    Past Medical and Surgical History:  No past medical history on file.  No past surgical history on file.    Allergies:  Allergies   Allergen Reactions   . Almond Meal Rash   . Captopril Rash   . Chlorthalidone Rash   . Corn  & Callus Remover [Salicylic Acid] Rash   . Wheat Bran Rash   . Zinc Acetate Rash   . Sertraline Unspecified       Medications:    Current Outpatient Medications:   .  amLODIPINE-benazepril (LOTREL) 2.5-10 MG capsule, Take 1 capsule by mouth daily., Disp: , Rfl:   .  azelastine (ASTELIN) 0.1 % nasal spray, Spray 1 spray into each nostril 2 times daily. Use in each nostril as directed, Disp: 1 bottle, Rfl: 2  .  Levothyroxine Sodium 50 MCG/ML SOLN, , Disp: , Rfl:   .  loratadine (CLARITIN) 10 MG tablet, Take 10 mg by mouth daily., Disp: , Rfl:   .  propranolol (INDERAL) 10 MG tablet, Take 10 mg by  mouth 2 times daily., Disp: , Rfl:       FH: No FH of atopic disease: asthma, allergic rhinitis, eczema, food allergies    Review of Systems:  General: No fevers, chills, weight loss, night sweats.  Neuro: +dizziness, imbalance  HEENT: See HPI  Resp: No cough, SOB, wheezing, chest tightness  CV: No murmur, palpitations, chest pain  GI: No vomiting, diarrhea, abdominal pain  Musc: No arthralgias, no joint  swelling  Skin: No skin lesions or urticaria currently; prior pruritus/rash  Complete 14 point ROS negative except as outlined above.    Physical Exam:   10/31/18  0932   BP: 150/79   Pulse: 67   Resp: 16   Temp: 98.2 F (36.8 C)     Constitutional: WNWD, NAD, well-groomed  HEENT: NCAT, no conjunctivitis, no allergic shiners; turbinates minimally enlarged, no drainage  Cardiovascular: RRR no murmur, no edema  Respiratory: no respiratory distress, no accessory muscle usage  Skin: no rashes or lesions    Medical Decision Making:  Previous medical records were reviewed in detail prior to this visit    Impression:  72 year old here for evaluation of:  1. Dizziness  2. Chronic rhinitis  3. Adverse food reaction  4. Dermatitis     Recommendations:  1. Dizziness  Dizziness, imbalance and transient vision loss is out of the scope of my practice and new reports that she has had excellent and negative testing to date. Explained to patient that allergic contributions to dizziness would be atypical and indeed she has had marked improvement in her rhinitis on azelastine but no change in dizziness  -Continue work-up with PCP, cardiology, neurology, and possibly ophthalmology given vision concerns - defer to PCP    2. Chronic nonallergic rhinitis  Clinical history is suggestive of possible allergic rhinitis but testing negative. Likely vasomotor/irritant induced rhinitis and she has improved significantly on azelastine. She can continue this medication and will follow-up as needed if symptoms worsen. , and she will undergo aero-allergen in-vitro testing to identify current and clinically relevant sensitizations. Given ongoing symptoms, strongly recommend azelastine trial to control rhinorrhea and sneezing.   -Continue azelastine 2 sprays each nostril twice a day - can be used as needed as well.   -Proper inhaler technique demonstrated and reviewed.    3. Adverse food reaction  Negative IgE to corn, wheat, almond, and per patient  she tolerates these foods. Will return if new food concerns arise.     4. Dermatitis  Resolved at the moment, with possible food triggers.  Vanicream recommended as daily moisturization.     RTC as needed    The plan of care was discussed with the patient who expressed agreement and understanding. Questions were answered prior to conclusion of the visit.     Rockie Neighbours, MD  Clinical Assistant Professor, [facility]

## 2018-10-31 ENCOUNTER — Ambulatory Visit: Payer: 59 | Attending: Allergy | Admitting: Allergy

## 2018-10-31 ENCOUNTER — Encounter (HOSPITAL_BASED_OUTPATIENT_CLINIC_OR_DEPARTMENT_OTHER): Payer: Self-pay | Admitting: Allergy

## 2018-10-31 VITALS — BP 150/79 | HR 67 | Temp 98.2°F | Resp 16 | Ht 60.0 in | Wt 120.5 lb

## 2018-10-31 DIAGNOSIS — J310 Chronic rhinitis: Principal | ICD-10-CM | POA: Insufficient documentation

## 2018-10-31 DIAGNOSIS — T781XXD Other adverse food reactions, not elsewhere classified, subsequent encounter: Secondary | ICD-10-CM | POA: Insufficient documentation

## 2018-10-31 DIAGNOSIS — R42 Dizziness and giddiness: Secondary | ICD-10-CM | POA: Insufficient documentation

## 2018-10-31 NOTE — Patient Instructions (Addendum)
After visit summary:    Continue azelastine 2 sprays each nostril twice a day for your sneezing and runny nose.   It does appear you have a wheat or corn allergy.   Come back if your symptoms worsen   I recommend VANICREAM for daily moisturization (you can get this at [employer] or [employer] or [employer])

## 2020-02-01 ENCOUNTER — Encounter (INDEPENDENT_AMBULATORY_CARE_PROVIDER_SITE_OTHER): Payer: Self-pay

## 2022-09-04 ENCOUNTER — Encounter: Payer: Self-pay | Admitting: Primary Care

## 2022-09-27 ENCOUNTER — Other Ambulatory Visit: Payer: Self-pay

## 2022-09-27 DIAGNOSIS — Z1231 Encounter for screening mammogram for malignant neoplasm of breast: Secondary | ICD-10-CM

## 2022-10-01 ENCOUNTER — Ambulatory Visit: Payer: Self-pay | Attending: Hematology and Oncology | Admitting: Hematology and Oncology

## 2022-10-01 ENCOUNTER — Ambulatory Visit
Admission: RE | Admit: 2022-10-01 | Discharge: 2022-10-01 | Disposition: A | Discharge status: Home / Self Care | Payer: Self-pay | Source: Ambulatory Visit | Attending: Obstetrics and Gynecology | Admitting: Obstetrics and Gynecology

## 2022-10-01 VITALS — BP 122/58 | Wt 148.8 lb

## 2022-10-01 DIAGNOSIS — Z1231 Encounter for screening mammogram for malignant neoplasm of breast: Secondary | ICD-10-CM

## 2022-10-01 NOTE — Progress Notes (Addendum)
Beth Moran is a 76 y.o. female who presents to [facility] clinic today with no complaints .    Pap Smear: Pap not smear completed today. Last Pap smear was 2013 at [facility] clinic and was normal. Per patient has no history of an abnormal Pap smear. Last Pap smear result is available in Epic.   Physical exam: Breasts Breasts symmetrical. No skin abnormalities bilateral breasts. No nipple retraction bilateral breasts. No nipple discharge bilateral breasts. No lymphadenopathy. No lumps palpated bilateral breasts.     MM 3D SCREEN BREAST BILATERAL  Result Date: 08/27/2018 CLINICAL DATA:  Screening. EXAM: DIGITAL SCREENING BILATERAL MAMMOGRAM WITH TOMO AND CAD COMPARISON:  Previous exam(s). ACR Breast Density Category b: There are scattered areas of fibroglandular density. FINDINGS: There are no findings suspicious for malignancy. Images were processed with CAD. IMPRESSION: No mammographic evidence of malignancy. A result letter of this screening mammogram will be mailed directly to the patient. RECOMMENDATION: Screening mammogram in one year. (Code:SM-B-01Y) BI-RADS CATEGORY  1: Negative. Electronically Signed   By: Ammie Ferrier M.D.   On: 08/27/2018 09:55      Pelvic/Bimanual Pap is not indicated today    Smoking History: Patient has never smoked and was not referred to quit line.    Patient Navigation: Patient education provided. Access to services provided for patient through [place] interpreter provided. No transportation provided   Colorectal Cancer Screening: Per patient has never had colonoscopy completed No complaints today. FIT test 09/23 negative.    Breast and Cervical Cancer Risk Assessment: Patient does not have family history of breast cancer, known genetic mutations, or radiation treatment to the chest before age 19. Patient does not have history of cervical dysplasia, immunocompromised, or DES exposure in-utero.  Risk Assessment     Risk  Scores       10/01/2022   Last edited by: Demetrius Revel, LPN   5-year risk: 0.9 %   Lifetime risk: 2 %            A: [facility] exam without pap smear No complaints with benign exam.   P: [facility]. Appointment scheduled 10/01/2022.  Dayton Scrape A, NP 10/01/2022 1:11 PM

## 2023-11-18 ENCOUNTER — Telehealth: Payer: Self-pay | Admitting: *Deleted

## 2023-12-11 ENCOUNTER — Other Ambulatory Visit: Payer: Self-pay | Admitting: Primary Care

## 2023-12-11 DIAGNOSIS — Z1231 Encounter for screening mammogram for malignant neoplasm of breast: Secondary | ICD-10-CM

## 2024-01-02 ENCOUNTER — Ambulatory Visit
Admission: RE | Admit: 2024-01-02 | Discharge: 2024-01-02 | Disposition: A | Discharge status: Home / Self Care | Payer: Self-pay | Source: Ambulatory Visit | Attending: Primary Care | Admitting: Primary Care

## 2024-01-02 DIAGNOSIS — Z1231 Encounter for screening mammogram for malignant neoplasm of breast: Secondary | ICD-10-CM | POA: Insufficient documentation

## 2025-01-05 ENCOUNTER — Telehealth: Payer: Self-pay
